# Patient Record
Sex: Female | Born: 1965 | Race: Black or African American | Hispanic: No | Marital: Single | Smoking: Former smoker
Health system: Southern US, Community
[De-identification: ages and names within clinical notes are randomized; demographics above are authoritative.]

## PROBLEM LIST (undated history)

## (undated) DIAGNOSIS — I10 Essential (primary) hypertension: Secondary | ICD-10-CM

---

## 2011-08-28 ENCOUNTER — Emergency Department (HOSPITAL_BASED_OUTPATIENT_CLINIC_OR_DEPARTMENT_OTHER)
Admission: EM | Admit: 2011-08-28 | Discharge: 2011-08-28 | Disposition: A | Discharge status: Home / Self Care | Payer: BC Managed Care – PPO | Attending: Emergency Medicine | Admitting: Emergency Medicine

## 2011-08-28 ENCOUNTER — Emergency Department (INDEPENDENT_AMBULATORY_CARE_PROVIDER_SITE_OTHER): Payer: BC Managed Care – PPO

## 2011-08-28 ENCOUNTER — Other Ambulatory Visit: Payer: Self-pay

## 2011-08-28 ENCOUNTER — Encounter: Payer: Self-pay | Admitting: *Deleted

## 2011-08-28 DIAGNOSIS — R000 Tachycardia, unspecified: Secondary | ICD-10-CM

## 2011-08-28 DIAGNOSIS — R079 Chest pain, unspecified: Secondary | ICD-10-CM | POA: Insufficient documentation

## 2011-08-28 DIAGNOSIS — I10 Essential (primary) hypertension: Secondary | ICD-10-CM | POA: Insufficient documentation

## 2011-08-28 DIAGNOSIS — R0602 Shortness of breath: Secondary | ICD-10-CM | POA: Insufficient documentation

## 2011-08-28 HISTORY — DX: Essential (primary) hypertension: I10

## 2011-08-28 LAB — CBC
HCT: 39.1 % (ref 36.0–46.0)
Hemoglobin: 13.5 g/dL (ref 12.0–15.0)
MCHC: 34.5 g/dL (ref 30.0–36.0)
MCV: 86.1 fL (ref 78.0–100.0)

## 2011-08-28 LAB — BASIC METABOLIC PANEL
BUN: 10 mg/dL (ref 6–23)
GFR calc non Af Amer: >60 mL/min (ref >60)
Glucose, Bld: 102 mg/dL — ABNORMAL HIGH (ref 70–99)
Potassium: 3.3 mEq/L — ABNORMAL LOW (ref 3.5–5.1)

## 2011-08-28 LAB — CARDIAC PANEL(CRET KIN+CKTOT+MB+TROPI)
Relative Index: 1 (ref 0.0–2.5)
Troponin I: <0.3 ng/mL (ref <0.30)

## 2011-08-28 MED ORDER — IOHEXOL 350 MG/ML SOLN
80.0000 mL | Freq: Once | INTRAVENOUS | Status: AC | PRN
  Administered 2011-08-28: 80 mL via INTRAVENOUS

## 2011-08-28 NOTE — ED Notes (Signed)
States her heart has been pounding on and off for a week. Pain in her upper chest into her back.  No sob. Denies diaphoresis.

## 2011-08-28 NOTE — ED Provider Notes (Signed)
History     CSN: [id number] Arrival date & time: 08/28/2011  6:35 PM  Chief Complaint  Patient presents with  . Chest Pain    (Consider location/radiation/quality/duration/timing/severity/associated sxs/prior treatment) HPI Comments: Pt states that she fractured her wrist 9 weeks ago and she has been laying around since:pt denies history of similar symptoms  Patient is a 45 y.o. female presenting with chest pain. The history is provided by the patient. No language interpreter was used.  Chest Pain The chest pain began 5 - 7 days ago. Chest pain occurs intermittently. The chest pain is unchanged. The quality of the pain is described as sharp. The pain does not radiate. Primary symptoms include shortness of breath and palpitations. Pertinent negatives for primary symptoms include no fever, no syncope, no cough, no wheezing, no nausea and no vomiting.  The palpitations also occurred with shortness of breath.  She tried nothing for the symptoms. Risk factors include lack of exercise and obesity.     Past Medical History  Diagnosis Date  . Hypertension     History reviewed. No pertinent past surgical history.  No family history on file.  History  Substance Use Topics  . Smoking status: Current Some Day Smoker  . Smokeless tobacco: Not on file  . Alcohol Use: No    OB History    Grav Para Term Preterm Abortions TAB SAB Ect Mult Living                  Review of Systems  Constitutional: Negative for fever.  Respiratory: Positive for shortness of breath. Negative for cough and wheezing.   Cardiovascular: Positive for chest pain and palpitations. Negative for syncope.  Gastrointestinal: Negative for nausea and vomiting.  All other systems reviewed and are negative.    Allergies  Sulfa antibiotics  Home Medications   Current Outpatient Rx  Name Route Sig Dispense Refill  . ALBUTEROL SULFATE HFA 108 (90 BASE) MCG/ACT IN AERS Inhalation Inhale 2 puffs into the lungs  every 6 (six) hours as needed. For shortness of breath     . AMLODIPINE BESYLATE-VALSARTAN 10-160 MG PO TABS Oral Take 1 tablet by mouth daily.      Marland Kitchen FLUTICASONE-SALMETEROL 250-50 MCG/DOSE IN AEPB Inhalation Inhale 1 puff into the lungs every morning.      Marland Kitchen LOVASTATIN 20 MG PO TABS Oral Take by mouth every morning.      Marland Kitchen OMEPRAZOLE 20 MG PO CPDR Oral Take 20 mg by mouth daily.        BP 150/88  Pulse 120  Temp(Src) 98.9 F (37.2 C) (Oral)  Resp 22  SpO2 100%  Physical Exam  Nursing note and vitals reviewed. Constitutional: She is oriented to person, place, and time. She appears well-developed and well-nourished.  HENT:  Head: Normocephalic and atraumatic.  Eyes: Pupils are equal, round, and reactive to light.  Neck: Normal range of motion.  Cardiovascular: Normal rate and regular rhythm.   Pulmonary/Chest: Effort normal and breath sounds normal.  Abdominal: Soft. Bowel sounds are normal.  Musculoskeletal: Normal range of motion.  Neurological: She is alert and oriented to person, place, and time.  Skin: Skin is warm and dry.  Psychiatric: She has a normal mood and affect.    ED Course  Procedures (including critical care time)  Labs Reviewed  CBC - Abnormal; Notable for the following:    WBC 12.7 (*)    All other components within normal limits  BASIC METABOLIC PANEL - Abnormal; Notable for  the following:    Potassium 3.3 (*)    Glucose, Bld 102 (*)    All other components within normal limits  CARDIAC PANEL(CRET KIN+CKTOT+MB+TROPI) - Abnormal; Notable for the following:    Total CK 190 (*)    All other components within normal limits   No results found.   No diagnosis found.  Date: 08/28/2011  Rate: 112  Rhythm: sinus tachycardia  QRS Axis: normal  Intervals: normal  ST/T Wave abnormalities: normal  Conduction Disutrbances:none  Narrative Interpretation:   Old EKG Reviewed: none available    MDM   Pt not having cp at this time:symptoms have been  going on for one week:pt ekg not acute:discussed with pt that she should have her thyroid checked, pt that's that she "had the thyroid removed"        Teressa Lower, NP 08/28/11 2047

## 2011-08-28 NOTE — ED Notes (Signed)
Long conversation with pt..stressed over several different things for several weeks

## 2011-08-28 NOTE — ED Provider Notes (Signed)
Medical screening examination/treatment/procedure(s) were performed by non-physician practitioner and as supervising physician I was immediately available for consultation/collaboration.  Derian Pfost R. Borna Wessinger, MD 08/28/11 2300 

## 2012-03-03 DIAGNOSIS — M549 Dorsalgia, unspecified: Secondary | ICD-10-CM | POA: Insufficient documentation

## 2013-05-07 DIAGNOSIS — B009 Herpesviral infection, unspecified: Secondary | ICD-10-CM | POA: Insufficient documentation

## 2013-05-07 DIAGNOSIS — K219 Gastro-esophageal reflux disease without esophagitis: Secondary | ICD-10-CM | POA: Insufficient documentation

## 2013-05-07 DIAGNOSIS — M5430 Sciatica, unspecified side: Secondary | ICD-10-CM | POA: Insufficient documentation

## 2013-05-07 DIAGNOSIS — N3020 Other chronic cystitis without hematuria: Secondary | ICD-10-CM | POA: Insufficient documentation

## 2013-05-07 DIAGNOSIS — J45909 Unspecified asthma, uncomplicated: Secondary | ICD-10-CM | POA: Insufficient documentation

## 2013-08-26 DIAGNOSIS — E785 Hyperlipidemia, unspecified: Secondary | ICD-10-CM | POA: Insufficient documentation

## 2013-08-26 DIAGNOSIS — J309 Allergic rhinitis, unspecified: Secondary | ICD-10-CM | POA: Insufficient documentation

## 2013-08-26 DIAGNOSIS — E668 Other obesity: Secondary | ICD-10-CM | POA: Insufficient documentation

## 2013-08-31 ENCOUNTER — Encounter: Payer: Self-pay | Admitting: Obstetrics & Gynecology

## 2013-09-01 DIAGNOSIS — G4710 Hypersomnia, unspecified: Secondary | ICD-10-CM | POA: Insufficient documentation

## 2013-09-01 DIAGNOSIS — I10 Essential (primary) hypertension: Secondary | ICD-10-CM | POA: Insufficient documentation

## 2013-09-01 DIAGNOSIS — M1990 Unspecified osteoarthritis, unspecified site: Secondary | ICD-10-CM | POA: Insufficient documentation

## 2013-09-09 DIAGNOSIS — R928 Other abnormal and inconclusive findings on diagnostic imaging of breast: Secondary | ICD-10-CM | POA: Insufficient documentation

## 2013-09-16 DIAGNOSIS — M5414 Radiculopathy, thoracic region: Secondary | ICD-10-CM | POA: Insufficient documentation

## 2013-09-16 DIAGNOSIS — M4716 Other spondylosis with myelopathy, lumbar region: Secondary | ICD-10-CM | POA: Insufficient documentation

## 2013-09-19 ENCOUNTER — Ambulatory Visit: Payer: Self-pay | Admitting: Obstetrics & Gynecology

## 2013-10-26 ENCOUNTER — Ambulatory Visit (INDEPENDENT_AMBULATORY_CARE_PROVIDER_SITE_OTHER): Payer: BC Managed Care – PPO | Admitting: Obstetrics & Gynecology

## 2013-10-26 ENCOUNTER — Encounter: Payer: Self-pay | Admitting: Obstetrics & Gynecology

## 2013-10-26 VITALS — BP 149/87 | HR 93 | Temp 97.4°F | Ht 67.0 in | Wt 354.0 lb

## 2013-10-26 DIAGNOSIS — Z01419 Encounter for gynecological examination (general) (routine) without abnormal findings: Secondary | ICD-10-CM

## 2013-10-26 DIAGNOSIS — N760 Acute vaginitis: Secondary | ICD-10-CM

## 2013-10-26 MED ORDER — NITROFURANTOIN MONOHYD MACRO 100 MG PO CAPS: ORAL_CAPSULE | ORAL | Status: DC

## 2013-10-26 MED ORDER — LEVONORGESTREL-ETHINYL ESTRAD 0.15-30 MG-MCG PO TABS: 1.0000 | ORAL_TABLET | Freq: Every day | ORAL | Status: DC

## 2013-10-26 NOTE — Progress Notes (Signed)
Subjective:     Anna Contreras is a 47 y.o. female here for a routine annual exam.  Current complaints: pt states that before she starts her period she feels as though she has yeast/bacterial infection.  Pt states that she has been prescribed macrobid by Dr Mikey Bussing previously. Pt states that she did have relief with this.  Personal health questionnaire reviewed: yes.   Gynecologic History Patient's last menstrual period was 09/21/2013. Contraception: none Last Pap: 2012. Results were: normal Last mammogram: May 2014. Results were: abnormal- pt states was given result of fatty tissue. Pt to f/u in 19mo  Obstetric History OB History  Gravida Para Term Preterm AB SAB TAB Ectopic Multiple Living  2 2 2       2     # Outcome Date GA Lbr Len/2nd Weight Sex Delivery Anes PTL Lv  2 TRM 08/19/93    F    Y  1 TRM 11/29/87    F    Y       The following portions of the patient's history were reviewed and updated as appropriate: allergies, current medications, past family history, past medical history, past social history, past surgical history and problem list.  Review of Systems Pertinent items are noted in HPI.    Objective:       General appearance: alert Breasts: normal appearance, no masses or tenderness Abdomen: soft, non-tender; bowel sounds normal; no masses,  no organomegaly Pelvic: cervix normal in appearance, external genitalia normal, no adnexal masses or tenderness, uterus normal size, shape, and consistency and vagina normal without discharge       Assessment:   Normal exam Plan:   Orders Placed This Encounter  Procedures  . WET PREP BY MOLECULAR PROBE  Return prn

## 2013-10-27 NOTE — Patient Instructions (Addendum)

## 2013-10-31 LAB — PAP IG, CT-NG NAA, HPV HIGH-RISK: GC Probe Amp: NEGATIVE

## 2013-11-11 DIAGNOSIS — R609 Edema, unspecified: Secondary | ICD-10-CM | POA: Insufficient documentation

## 2013-11-18 ENCOUNTER — Other Ambulatory Visit: Payer: Self-pay | Admitting: *Deleted

## 2013-11-18 MED ORDER — METRONIDAZOLE 0.75 % VA GEL: VAGINAL | Status: DC

## 2013-11-25 DIAGNOSIS — R9430 Abnormal result of cardiovascular function study, unspecified: Secondary | ICD-10-CM | POA: Insufficient documentation

## 2014-02-09 DIAGNOSIS — G4733 Obstructive sleep apnea (adult) (pediatric): Secondary | ICD-10-CM | POA: Insufficient documentation

## 2014-05-01 DIAGNOSIS — R7303 Prediabetes: Secondary | ICD-10-CM | POA: Insufficient documentation

## 2014-05-01 DIAGNOSIS — F419 Anxiety disorder, unspecified: Secondary | ICD-10-CM

## 2014-05-01 DIAGNOSIS — F329 Major depressive disorder, single episode, unspecified: Secondary | ICD-10-CM | POA: Insufficient documentation

## 2014-10-02 ENCOUNTER — Encounter: Payer: Self-pay | Admitting: Obstetrics & Gynecology

## 2014-10-16 ENCOUNTER — Other Ambulatory Visit: Payer: Self-pay | Admitting: *Deleted

## 2014-10-16 MED ORDER — LEVONORGESTREL-ETHINYL ESTRAD 0.15-30 MG-MCG PO TABS: 1.0000 | ORAL_TABLET | Freq: Every day | ORAL | Status: DC

## 2014-10-16 NOTE — Progress Notes (Unsigned)
Pt called to office requesting refill on birth control.  Pt has an AEX later this month.  Refill was sent to pharmacy to get pt through AEX date.

## 2014-10-30 ENCOUNTER — Ambulatory Visit: Payer: BC Managed Care – PPO | Admitting: Obstetrics & Gynecology

## 2014-11-17 ENCOUNTER — Other Ambulatory Visit: Payer: Self-pay | Admitting: Obstetrics & Gynecology

## 2014-11-27 ENCOUNTER — Encounter: Payer: Self-pay | Admitting: *Deleted

## 2014-11-28 ENCOUNTER — Encounter: Payer: Self-pay | Admitting: Obstetrics & Gynecology

## 2014-12-11 ENCOUNTER — Telehealth: Payer: Self-pay | Admitting: *Deleted

## 2014-12-11 ENCOUNTER — Ambulatory Visit: Payer: BC Managed Care – PPO | Admitting: Obstetrics & Gynecology

## 2014-12-11 DIAGNOSIS — Z3041 Encounter for surveillance of contraceptive pills: Secondary | ICD-10-CM

## 2014-12-11 DIAGNOSIS — B3731 Acute candidiasis of vulva and vagina: Secondary | ICD-10-CM

## 2014-12-11 DIAGNOSIS — B373 Candidiasis of vulva and vagina: Secondary | ICD-10-CM

## 2014-12-11 MED ORDER — LEVONORGESTREL-ETHINYL ESTRAD 0.15-30 MG-MCG PO TABS: 1.0000 | ORAL_TABLET | Freq: Every day | ORAL | Status: DC

## 2014-12-11 MED ORDER — FLUCONAZOLE 150 MG PO TABS: 150.0000 mg | ORAL_TABLET | Freq: Once | ORAL | Status: DC

## 2014-12-11 NOTE — Telephone Encounter (Signed)
Patient got letter and she needs an annual exam. Patient wants to see the midwife when she gets here. Patient request a refill on her birth control and she states she needs Diflucan for a yeast infection. LM on VM- Rx refilled at pharmacy listed.

## 2015-01-15 ENCOUNTER — Other Ambulatory Visit: Payer: Self-pay | Admitting: *Deleted

## 2015-01-15 DIAGNOSIS — Z3041 Encounter for surveillance of contraceptive pills: Secondary | ICD-10-CM

## 2015-01-15 MED ORDER — LEVONORGESTREL-ETHINYL ESTRAD 0.15-30 MG-MCG PO TABS: 1.0000 | ORAL_TABLET | Freq: Every day | ORAL | Status: DC

## 2015-01-18 ENCOUNTER — Ambulatory Visit: Payer: BC Managed Care – PPO | Admitting: Certified Nurse Midwife

## 2015-01-24 ENCOUNTER — Ambulatory Visit: Payer: BC Managed Care – PPO | Admitting: Certified Nurse Midwife

## 2015-01-30 ENCOUNTER — Encounter: Payer: Self-pay | Admitting: Certified Nurse Midwife

## 2015-01-30 ENCOUNTER — Ambulatory Visit (INDEPENDENT_AMBULATORY_CARE_PROVIDER_SITE_OTHER): Payer: BC Managed Care – PPO | Admitting: Certified Nurse Midwife

## 2015-01-30 DIAGNOSIS — B9689 Other specified bacterial agents as the cause of diseases classified elsewhere: Secondary | ICD-10-CM

## 2015-01-30 DIAGNOSIS — A499 Bacterial infection, unspecified: Secondary | ICD-10-CM

## 2015-01-30 DIAGNOSIS — N390 Urinary tract infection, site not specified: Secondary | ICD-10-CM

## 2015-01-30 DIAGNOSIS — N760 Acute vaginitis: Secondary | ICD-10-CM

## 2015-01-30 DIAGNOSIS — Z3041 Encounter for surveillance of contraceptive pills: Secondary | ICD-10-CM

## 2015-01-30 DIAGNOSIS — Z01419 Encounter for gynecological examination (general) (routine) without abnormal findings: Secondary | ICD-10-CM | POA: Diagnosis not present

## 2015-01-30 MED ORDER — METRONIDAZOLE 0.75 % VA GEL: VAGINAL | Status: DC

## 2015-01-30 MED ORDER — METRONIDAZOLE 250 MG PO TABS: 250.0000 mg | ORAL_TABLET | Freq: Two times a day (BID) | ORAL | Status: AC

## 2015-01-30 MED ORDER — LEVONORGESTREL-ETHINYL ESTRAD 0.15-30 MG-MCG PO TABS: 1.0000 | ORAL_TABLET | Freq: Every day | ORAL | Status: DC

## 2015-01-30 MED ORDER — NITROFURANTOIN MONOHYD MACRO 100 MG PO CAPS: ORAL_CAPSULE | ORAL | Status: DC

## 2015-01-30 NOTE — Progress Notes (Signed)
Patient ID: Anna Contreras, female   DOB: September 19, 1966, 49 y.o.   MRN: [id number]   Subjective:     Anna Contreras is a 49 y.o. female here for a routine exam.  Current complaints: hot flashes, weight.  Denies any issues with menses, last 3-4 days with light bleeding.  Reports frequent UTI's post intercourse.  Reports "fishy" smell after sexual intercourse.  Desires to continue OCPs.    Personal health questionnaire:  Is patient Ashkenazi Jewish, have a family history of breast and/or ovarian cancer: no Is there a family history of uterine cancer diagnosed at age < 76, gastrointestinal cancer, urinary tract cancer, family member who is a Personnel officer syndrome-associated carrier: no Is the patient overweight and hypertensive, family history of diabetes, personal history of gestational diabetes, preeclampsia or PCOS: yes Is patient over 57, have PCOS,  family history of premature CHD under age 77, diabetes, smoke, have hypertension or peripheral artery disease:  yes At any time, has a partner hit, kicked or otherwise hurt or frightened you?: no Over the past 2 weeks, have you felt down, depressed or hopeless?: no Over the past 2 weeks, have you felt little interest or pleasure in doing things?:no   Gynecologic History Patient's last menstrual period was 01/23/2015. Contraception: OCP (estrogen/progesterone) Last Pap: 10/26/13. Results were: normal Last mammogram: unknown. Results were: according to the patient normal.  Obstetric History OB History  Gravida Para Term Preterm AB SAB TAB Ectopic Multiple Living  # Outcome Date GA Lbr Len/2nd Weight Sex Delivery Anes PTL Lv  2 Term 08/19/93    F    Y  1 Term 11/29/87    Anna Contreras      Past Medical History  Diagnosis Date  . Hypertension     History reviewed. No pertinent past surgical history.   Current outpatient prescriptions:  .  albuterol (PROVENTIL HFA;VENTOLIN HFA) 108 (90 BASE) MCG/ACT inhaler, Inhale 2 puffs into  the lungs every 6 (six) hours as needed. For shortness of breath , Disp: , Rfl:  .  amitriptyline (ELAVIL) 25 MG tablet, , Disp: , Rfl:  .  amLODipine-valsartan (EXFORGE) 10-320 MG per tablet, Take 1 tablet by mouth daily., Disp: , Rfl:  .  cyclobenzaprine (FLEXERIL) 5 MG tablet, , Disp: , Rfl:  .  Fluticasone-Salmeterol (ADVAIR DISKUS) 250-50 MCG/DOSE AEPB, Inhale 1 puff into the lungs every morning.  , Disp: , Rfl:  .  gabapentin (NEURONTIN) 300 MG capsule, , Disp: , Rfl:  .  levonorgestrel-ethinyl estradiol (PORTIA-28) 0.15-30 MG-MCG tablet, Take 1 tablet by mouth daily., Disp: 28 tablet, Rfl: 1 .  lovastatin (MEVACOR) 20 MG tablet, Take by mouth every morning.  , Disp: , Rfl:  .  metFORMIN (GLUCOPHAGE) 500 MG tablet, , Disp: , Rfl:  .  montelukast (SINGULAIR) 10 MG tablet, , Disp: , Rfl:  .  nitrofurantoin, macrocrystal-monohydrate, (MACROBID) 100 MG capsule, Take one tablet prior to intercourse as instructed, Disp: 30 capsule, Rfl: 5 .  omeprazole (PRILOSEC) 40 MG capsule, Take 40 mg by mouth daily.  , Disp: , Rfl:  .  Phentermine-Topiramate 3.75-23 MG CP24, Take by mouth., Disp: , Rfl:  .  potassium chloride SA (K-DUR,KLOR-CON) 20 MEQ tablet, , Disp: , Rfl:  .  fluconazole (DIFLUCAN) 150 MG tablet, Take 1 tablet (150 mg total) by mouth once., Disp: 1 tablet, Rfl: 2 .  metroNIDAZOLE (FLAGYL) 250 MG tablet, Take 1 tablet (250  mg total) by mouth 2 (two) times daily., Disp: 20 tablet, Rfl: 0 .  metroNIDAZOLE (METROGEL VAGINAL) 0.75 % vaginal gel, 1 applicator per vagina twice daily for five days then 2 times per week for 3 months., Disp: 70 g, Rfl: 3 .  omeprazole (PRILOSEC) 20 MG capsule, Take 20 mg by mouth daily.  , Disp: , Rfl:  .  topiramate (TOPAMAX) 50 MG tablet, , Disp: , Rfl:  .  triamcinolone cream (KENALOG) 0.1 %, , Disp: , Rfl:  Allergies  Allergen Reactions  . Sulfa Antibiotics Hives    History  Substance Use Topics  . Smoking status: Former Smoker    Quit date: 12/01/1997   . Smokeless tobacco: Not on file  . Alcohol Use: Yes     Comment: social    Family History  Problem Relation Age of Onset  . Diabetes Mother   . Heart disease Mother   . Heart disease Father   . Diabetes Maternal Grandmother   . Heart disease Paternal Grandmother       Review of Systems  Constitutional: negative for fatigue and weight loss Respiratory: negative for cough and wheezing Cardiovascular: negative for chest pain, fatigue and palpitations Gastrointestinal: negative for abdominal pain and change in bowel habits Musculoskeletal:negative for myalgias Neurological: negative for gait problems and tremors Behavioral/Psych: negative for abusive relationship, depression Endocrine: negative for temperature intolerance   Genitourinary:negative for abnormal menstrual periods, genital lesions, hot flashes, sexual problems and vaginal discharge Integument/breast: negative for breast lump, breast tenderness, nipple discharge and skin lesion(s)    Objective:       BP 146/87 mmHg  Pulse 108  Wt 172.82 kg (381 lb)  LMP 01/23/2015 General:   alert  Skin:   no rash or abnormalities  Lungs:   clear to auscultation bilaterally  Heart:   regular rate and rhythm, S1, S2 normal, no murmur, click, rub or gallop  Breasts:   normal without suspicious masses, skin or nipple changes or axillary nodes  Abdomen:  normal findings: no organomegaly, soft, non-tender and no hernia  Pelvis:  External genitalia: normal general appearance Urinary system: urethral meatus normal and bladder without fullness, nontender Vaginal: normal without tenderness, induration or masses Cervix: normal appearance Adnexa: normal bimanual exam Uterus: anteverted and non-tender, normal size   Lab Review Urine pregnancy test Labs reviewed yes Radiologic studies reviewed no   Assessment:    Healthy female exam.  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 Contraception: OCP (estrogen/progesterone). Mammogram ordered. Follow up in: 1 year.   Meds ordered this encounter  Medications  . nitrofurantoin, macrocrystal-monohydrate, (MACROBID) 100 MG capsule    Sig: Take one tablet prior to intercourse as instructed    Dispense:  30 capsule    Refill:  5  . metroNIDAZOLE (METROGEL VAGINAL) 0.75 % vaginal gel    Sig: 1 applicator per vagina twice daily for five days then 2 times per week for 3 months.    Dispense:  70 g    Refill:  3  . levonorgestrel-ethinyl estradiol (PORTIA-28) 0.15-30 MG-MCG tablet    Sig: Take 1 tablet by mouth daily.    Dispense:  28 tablet    Refill:  1    CYCLE FILL MEDICATION. Authorization is required for next refill.  . metroNIDAZOLE (FLAGYL) 250 MG tablet    Sig: Take 1  tablet (250 mg total) by mouth 2 (two) times daily.    Dispense:  20 tablet    Refill:  0   Orders Placed This Encounter  Procedures  . SureSwab, Vaginosis/Vaginitis Plus  . Ambulatory referral to Nutrition and Diabetic Education    Referral Priority:  Routine    Referral Type:  Consultation    Referral Reason:  Specialty Services Required    Number of Visits Requested:  1

## 2015-01-31 ENCOUNTER — Telehealth: Payer: Self-pay | Admitting: *Deleted

## 2015-01-31 NOTE — Telephone Encounter (Signed)
Patient states she was in the office and discussed Phentermine for weight loss. She would like to try that and request that a Rx be sent to the pharmacy.

## 2015-02-01 LAB — PAP IG AND HPV HIGH-RISK: HPV DNA High Risk: NOT DETECTED

## 2015-02-01 NOTE — Telephone Encounter (Signed)
OK, per Dr. Clearance CootsHarper.  He gave the go ahead.  Please write and have him sign.

## 2015-02-02 LAB — SURESWAB, VAGINOSIS/VAGINITIS PLUS
ATOPOBIUM VAGINAE: NOT DETECTED Log (cells/mL)
C. PARAPSILOSIS, DNA: NOT DETECTED
C. TRACHOMATIS RNA, TMA: NOT DETECTED
C. albicans, DNA: NOT DETECTED
C. glabrata, DNA: NOT DETECTED
C. tropicalis, DNA: NOT DETECTED
Gardnerella vaginalis: 5 Log (cells/mL)
LACTOBACILLUS SPECIES: NOT DETECTED Log (cells/mL)
MEGASPHAERA SPECIES: NOT DETECTED Log (cells/mL)
N. gonorrhoeae RNA, TMA: NOT DETECTED
T. vaginalis RNA, QL TMA: NOT DETECTED

## 2015-02-05 ENCOUNTER — Other Ambulatory Visit: Payer: Self-pay | Admitting: *Deleted

## 2015-02-05 DIAGNOSIS — E668 Other obesity: Secondary | ICD-10-CM

## 2015-02-05 MED ORDER — PHENTERMINE HCL 37.5 MG PO CAPS: 37.5000 mg | ORAL_CAPSULE | ORAL | Status: DC

## 2015-02-06 NOTE — Telephone Encounter (Signed)
Patient notified Rx ready- patient request we mail it. Patient advised of side effects and to track her BP- patient voices understanding.

## 2015-04-14 ENCOUNTER — Other Ambulatory Visit: Payer: Self-pay | Admitting: Certified Nurse Midwife

## 2015-04-18 ENCOUNTER — Telehealth: Payer: Self-pay | Admitting: *Deleted

## 2015-04-18 NOTE — Telephone Encounter (Signed)
Patient states she needs her refill on her birth control- she is out- Rx request sent to [facility].

## 2015-04-19 ENCOUNTER — Other Ambulatory Visit: Payer: Self-pay | Admitting: *Deleted

## 2015-04-19 ENCOUNTER — Other Ambulatory Visit: Payer: Self-pay | Admitting: Certified Nurse Midwife

## 2015-04-19 DIAGNOSIS — Z3041 Encounter for surveillance of contraceptive pills: Secondary | ICD-10-CM

## 2015-04-19 MED ORDER — LEVONORGESTREL-ETHINYL ESTRAD 0.15-30 MG-MCG PO TABS: 1.0000 | ORAL_TABLET | Freq: Every day | ORAL | Status: DC

## 2015-06-07 ENCOUNTER — Other Ambulatory Visit: Payer: Self-pay | Admitting: Obstetrics

## 2015-09-27 ENCOUNTER — Telehealth: Payer: Self-pay | Admitting: *Deleted

## 2015-09-27 ENCOUNTER — Other Ambulatory Visit: Payer: Self-pay | Admitting: Certified Nurse Midwife

## 2015-09-27 NOTE — Telephone Encounter (Signed)
Patient is asking for a refill of Phentermine. ( Please see problem list)

## 2015-09-27 NOTE — Telephone Encounter (Signed)
Please offer her a consultation for [facility] for gastric [facility].  I do not think with her hx of high blood pressure, obstructive sleep apnea, etc. That she is a good candidate for phentermine.  Depending on her insurance she may qualify for another weight loss medication.  Thank you.  Felisa Bonier. Emilee Market CNM

## 2015-09-28 ENCOUNTER — Other Ambulatory Visit: Payer: Self-pay | Admitting: Obstetrics

## 2015-10-02 ENCOUNTER — Other Ambulatory Visit: Payer: Self-pay | Admitting: Obstetrics

## 2015-10-03 NOTE — Telephone Encounter (Signed)
Patient notified she needs to see her primary care for management- longer term weight loss.

## 2015-10-30 ENCOUNTER — Other Ambulatory Visit: Payer: Self-pay | Admitting: Obstetrics

## 2015-12-25 ENCOUNTER — Other Ambulatory Visit: Payer: Self-pay | Admitting: Obstetrics

## 2015-12-25 NOTE — Telephone Encounter (Signed)
Patient called for a refill of her birth control. She is due an annual exam in March.

## 2016-03-12 ENCOUNTER — Other Ambulatory Visit: Payer: Self-pay | Admitting: Certified Nurse Midwife

## 2016-03-12 ENCOUNTER — Ambulatory Visit (INDEPENDENT_AMBULATORY_CARE_PROVIDER_SITE_OTHER): Payer: BC Managed Care – PPO | Admitting: Certified Nurse Midwife

## 2016-03-12 ENCOUNTER — Encounter: Payer: Self-pay | Admitting: Certified Nurse Midwife

## 2016-03-12 VITALS — BP 157/92 | HR 118 | Temp 98.9°F | Wt 377.0 lb

## 2016-03-12 DIAGNOSIS — Z01419 Encounter for gynecological examination (general) (routine) without abnormal findings: Secondary | ICD-10-CM

## 2016-03-12 DIAGNOSIS — Z1389 Encounter for screening for other disorder: Secondary | ICD-10-CM

## 2016-03-12 DIAGNOSIS — L918 Other hypertrophic disorders of the skin: Secondary | ICD-10-CM

## 2016-03-12 DIAGNOSIS — N760 Acute vaginitis: Secondary | ICD-10-CM | POA: Diagnosis not present

## 2016-03-12 DIAGNOSIS — L981 Factitial dermatitis: Secondary | ICD-10-CM | POA: Diagnosis not present

## 2016-03-12 DIAGNOSIS — E669 Obesity, unspecified: Secondary | ICD-10-CM

## 2016-03-12 DIAGNOSIS — Z3041 Encounter for surveillance of contraceptive pills: Secondary | ICD-10-CM

## 2016-03-12 DIAGNOSIS — R319 Hematuria, unspecified: Secondary | ICD-10-CM

## 2016-03-12 LAB — POCT URINALYSIS DIPSTICK
BILIRUBIN UA: NEGATIVE
GLUCOSE UA: NEGATIVE
KETONES UA: NEGATIVE
Nitrite, UA: NEGATIVE
PH UA: 5
SPEC GRAV UA: 1.01
Urobilinogen, UA: NEGATIVE

## 2016-03-12 MED ORDER — TERCONAZOLE 0.4 % VA CREA: 1.0000 | TOPICAL_CREAM | Freq: Every day | VAGINAL | Status: DC

## 2016-03-12 MED ORDER — NORETHINDRONE 0.35 MG PO TABS
1.0000 | ORAL_TABLET | Freq: Every day | ORAL | Status: DC
Start: 2016-03-12 — End: 2017-03-30

## 2016-03-12 MED ORDER — FLUCONAZOLE 200 MG PO TABS: 200.0000 mg | ORAL_TABLET | Freq: Once | ORAL | Status: DC

## 2016-03-12 NOTE — Addendum Note (Signed)
Addended by: Marya LandryFOSTER, SUZANNE D on: 03/12/2016 04:56 PM   Modules accepted: Orders

## 2016-03-12 NOTE — Progress Notes (Signed)
Patient ID: Anna Contreras, female   DOB: 11-Jun-1966, 50 y.o.   MRN: [id number]    Subjective:        Anna Contreras is a 50 y.o. female here for a routine exam.  Current complaints: desires skin tag removal.  States that she has mammograms done in [place].  Declines STD screening.  States that she is on disability now.  Will have full medicare in June-July.    Personal health questionnaire:  Is patient Ashkenazi Jewish, have a family history of breast and/or ovarian cancer: no Is there a family history of uterine cancer diagnosed at age < 68, gastrointestinal cancer, urinary tract cancer, family member who is a Personnel officer syndrome-associated carrier: no Is the patient overweight and hypertensive, family history of diabetes, personal history of gestational diabetes, preeclampsia or PCOS: yes Is patient over 9, have PCOS,  family history of premature CHD under age 82, diabetes, smoke, have hypertension or peripheral artery disease:  yes At any time, has a partner hit, kicked or otherwise hurt or frightened you?: no Over the past 2 weeks, have you felt down, depressed or hopeless?: yes Over the past 2 weeks, have you felt little interest or pleasure in doing things?:no   Gynecologic History No LMP recorded. Contraception: OCP (estrogen/progesterone) Last Pap: 01/2015. Results were: normal Last mammogram: unknown, ordered but patient did not schedule the study.  Obstetric History OB History  Gravida Para Term Preterm AB SAB TAB Ectopic Multiple Living  # Outcome Date GA Lbr Len/2nd Weight Sex Delivery Anes PTL Lv  2 Term 08/19/93    F    Y  1 Term 11/29/87    [name]      Past Medical History  Diagnosis Date  . Hypertension     No past surgical history on file.   Current outpatient prescriptions:  .  albuterol (PROVENTIL HFA;VENTOLIN HFA) 108 (90 BASE) MCG/ACT inhaler, Inhale 2 puffs into the lungs every 6 (six) hours as needed. For shortness of breath ,  Disp: , Rfl:  .  amitriptyline (ELAVIL) 25 MG tablet, , Disp: , Rfl:  .  amLODipine-valsartan (EXFORGE) 10-320 MG per tablet, Take 1 tablet by mouth daily., Disp: , Rfl:  .  cyclobenzaprine (FLEXERIL) 5 MG tablet, , Disp: , Rfl:  .  Fluticasone-Salmeterol (ADVAIR DISKUS) 250-50 MCG/DOSE AEPB, Inhale 1 puff into the lungs every morning.  , Disp: , Rfl:  .  gabapentin (NEURONTIN) 300 MG capsule, , Disp: , Rfl:  .  lovastatin (MEVACOR) 20 MG tablet, Take by mouth every morning.  , Disp: , Rfl:  .  metFORMIN (GLUCOPHAGE) 500 MG tablet, , Disp: , Rfl:  .  metroNIDAZOLE (METROGEL VAGINAL) 0.75 % vaginal gel, 1 applicator per vagina twice daily for five days then 2 times per week for 3 months., Disp: 70 g, Rfl: 3 .  montelukast (SINGULAIR) 10 MG tablet, , Disp: , Rfl:  .  nitrofurantoin, macrocrystal-monohydrate, (MACROBID) 100 MG capsule, Take one tablet prior to intercourse as instructed, Disp: 30 capsule, Rfl: 5 .  omeprazole (PRILOSEC) 20 MG capsule, Take 20 mg by mouth daily.  , Disp: , Rfl:  .  PORTIA-28 0.15-30 MG-MCG tablet, TAKE 1 TABLET BY MOUTH DAILY., Disp: 28 tablet, Rfl: 2 .  potassium chloride SA (K-DUR,KLOR-CON) 20 MEQ tablet, , Disp: , Rfl:  .  topiramate (TOPAMAX) 50 MG tablet, , Disp: , Rfl:  .  fluconazole (DIFLUCAN)  200 MG tablet, Take 1 tablet (200 mg total) by mouth once., Disp: 3 tablet, Rfl: 0 .  norethindrone (MICRONOR,CAMILA,ERRIN) 0.35 MG tablet, Take 1 tablet (0.35 mg total) by mouth daily., Disp: 1 Package, Rfl: 11 .  terconazole (TERAZOL 7) 0.4 % vaginal cream, Place 1 applicator vaginally at bedtime., Disp: 45 g, Rfl: 0 .  triamcinolone cream (KENALOG) 0.1 %, Reported on 03/12/2016, Disp: , Rfl:  Allergies  Allergen Reactions  . Sulfa Antibiotics Hives    Social History  Substance Use Topics  . Smoking status: Former Smoker    Quit date: 12/01/1997  . Smokeless tobacco: Not on file  . Alcohol Use: Yes     Comment: social    Family History  Problem Relation Age  of Onset  . Diabetes Mother   . Heart disease Mother   . Heart disease Father   . Diabetes Maternal Grandmother   . Heart disease Paternal Grandmother       Review of Systems  Constitutional: negative for fatigue and weight loss Respiratory: negative for cough and wheezing Cardiovascular: negative for chest pain, fatigue and palpitations Gastrointestinal: negative for abdominal pain and change in bowel habits Musculoskeletal:negative for myalgias Neurological: negative for gait problems and tremors Behavioral/Psych: negative for abusive relationship, depression Endocrine: negative for temperature intolerance   Genitourinary:negative for abnormal menstrual periods, genital lesions, hot flashes, sexual problems and vaginal discharge Integument/breast: negative for breast lump, breast tenderness, nipple discharge and skin lesion(s)    Objective:       BP 157/92 mmHg  Pulse 118  Temp(Src) 98.9 F (37.2 C)  Wt 377 lb (171.006 kg) General:   alert  Skin:   no rash or abnormalities  Lungs:   clear to auscultation bilaterally  Heart:   regular rate and rhythm, S1, S2 normal, no murmur, click, rub or gallop  Breasts:   normal without suspicious masses, skin or nipple changes or axillary nodes  Abdomen:  normal findings: no organomegaly, soft, non-tender and no hernia  Pelvis:  External genitalia: normal general appearance Urinary system: urethral meatus normal and bladder without fullness, nontender Vaginal: normal without tenderness, induration or masses Cervix: normal appearance Adnexa: normal bimanual exam Uterus: anteverted and non-tender, normal size  Skin Tag Removal Procedure Note    PROCEDURE: Skin tag removal Performing Provider: [facility] Mariko Nowakowski CNM   Patient education prior to procedure, explained risk, benefits of skin tag removal, reviewed alternative options. Patient reported understanding. Gave consent to continue with procedure.   PROCEDURE:  Pregnancy Text :   not indicated       Sterile Preparation:   Betadinex3    The patient's right check was palpated and the skin tag located. The area was prepped with Betadinex3. The area around the skin tag was anesthestized with 1.5 cc of 1% lidocaine without epinephrine was injected. A 1.5 mm incision was made and the skin tag was removed in an intact manner. Skin tag sent to pathology.  Hemostasis.     Followup: The patient tolerated the procedure well without complications.  Standard post-procedure care is explained and return precautions are given.  [facility] Ardenia Stiner CNM Lab Review Urine pregnancy test Labs reviewed yes Radiologic studies reviewed no  50% of 30 min visit spent on counseling and coordination of care.   Assessment:    Healthy female exam.   Obesity  Facial skin tag removal  H/O HTN    Plan:    Education reviewed: calcium supplements, depression evaluation, low fat, low cholesterol diet, safe  sex/STD prevention, self breast exams, skin cancer screening and weight bearing exercise. Contraception: oral progesterone-only contraceptive. Mammogram ordered. Follow up in: 1 year.   Meds ordered this encounter  Medications  . fluconazole (DIFLUCAN) 200 MG tablet    Sig: Take 1 tablet (200 mg total) by mouth once.    Dispense:  3 tablet    Refill:  0  . terconazole (TERAZOL 7) 0.4 % vaginal cream    Sig: Place 1 applicator vaginally at bedtime.    Dispense:  45 g    Refill:  0  . norethindrone (MICRONOR,CAMILA,ERRIN) 0.35 MG tablet    Sig: Take 1 tablet (0.35 mg total) by mouth daily.    Dispense:  1 Package    Refill:  11   Orders Placed This Encounter  Procedures  . Urine culture  . MM DIGITAL SCREENING BILATERAL    Standing Status: Future     Number of Occurrences:      Standing Expiration Date: 05/12/2017    Order Specific Question:  Reason for Exam (SYMPTOM  OR DIAGNOSIS REQUIRED)    Answer:  annual exam    Order Specific Question:  Is the patient pregnant?     Answer:  No    Order Specific Question:  Preferred imaging location?    Answer:  [facility]  . Ambulatory referral to General Surgery    Referral Priority:  Routine    Referral Type:  Surgical    Referral Reason:  Specialty Services Required    Requested Specialty:  General Surgery    Number of Visits Requested:  1  . POCT urinalysis dipstick   Need to obtain previous records for mammograms.

## 2016-03-14 LAB — URINE CULTURE

## 2016-03-15 LAB — NUSWAB VAGINITIS PLUS (VG+)
CHLAMYDIA TRACHOMATIS, NAA: NEGATIVE
Candida albicans, NAA: NEGATIVE
Candida glabrata, NAA: NEGATIVE
NEISSERIA GONORRHOEAE, NAA: NEGATIVE
TRICH VAG BY NAA: NEGATIVE

## 2016-03-18 ENCOUNTER — Other Ambulatory Visit: Payer: Self-pay | Admitting: Certified Nurse Midwife

## 2016-03-18 ENCOUNTER — Telehealth: Payer: Self-pay | Admitting: *Deleted

## 2016-03-18 DIAGNOSIS — N390 Urinary tract infection, site not specified: Secondary | ICD-10-CM

## 2016-03-18 LAB — PAP IG AND HPV HIGH-RISK
HPV, high-risk: NEGATIVE
PAP SMEAR COMMENT: 0

## 2016-03-18 MED ORDER — NITROFURANTOIN MONOHYD MACRO 100 MG PO CAPS: ORAL_CAPSULE | ORAL | Status: DC

## 2016-03-18 NOTE — Telephone Encounter (Signed)
Patient wants to know how to use the cream- the pharmacy questioned it- and the patient also wants a refill of her Macrobid- she uses it prophylactic to prevent UTI after intercourse. 4:07 Call to patient - told her Radene Ouerazol is used for 7 nights and I will let [facility] know about the Macrobid refill request.

## 2016-03-18 NOTE — Telephone Encounter (Signed)
Thank you.  Please let her know a macrobid refill & Rx has been sent to the pharmacy.  Thanks again.  R.Elea Holtzclaw CNM

## 2016-07-25 ENCOUNTER — Telehealth: Payer: Self-pay | Admitting: *Deleted

## 2016-07-25 ENCOUNTER — Other Ambulatory Visit: Payer: Self-pay | Admitting: Certified Nurse Midwife

## 2016-07-25 DIAGNOSIS — B3731 Acute candidiasis of vulva and vagina: Secondary | ICD-10-CM

## 2016-07-25 DIAGNOSIS — B373 Candidiasis of vulva and vagina: Secondary | ICD-10-CM

## 2016-07-25 MED ORDER — TERCONAZOLE 0.8 % VA CREA: 1.0000 | TOPICAL_CREAM | Freq: Every day | VAGINAL | 0 refills | Status: DC

## 2016-07-25 MED ORDER — FLUCONAZOLE 200 MG PO TABS
200.0000 mg | ORAL_TABLET | Freq: Once | ORAL | 0 refills | Status: AC
Start: 2016-07-25 — End: 2016-07-25

## 2016-07-25 NOTE — Telephone Encounter (Signed)
Patient is requesting a refill of the Diflucan and Terazol. It really worked well before and she would like to have it again.

## 2016-07-25 NOTE — Telephone Encounter (Signed)
Please let her know that they have been sent to the pharmacy and that she can pick them up at her convenience.  Thank you.  R.Cena Bruhn CNM

## 2016-07-28 NOTE — Telephone Encounter (Signed)
Voicemail not set up.

## 2017-03-30 ENCOUNTER — Telehealth: Payer: Self-pay | Admitting: *Deleted

## 2017-03-30 DIAGNOSIS — Z3041 Encounter for surveillance of contraceptive pills: Secondary | ICD-10-CM

## 2017-03-30 MED ORDER — NORETHINDRONE 0.35 MG PO TABS: 1.0000 | ORAL_TABLET | Freq: Every day | ORAL | 11 refills | Status: DC

## 2017-03-30 MED ORDER — NORETHINDRONE 0.35 MG PO TABS: 1.0000 | ORAL_TABLET | Freq: Every day | ORAL | 0 refills | Status: DC

## 2017-03-30 NOTE — Telephone Encounter (Signed)
Pt needs a refill of her Birth control pills sent her pharmacy she had to reschedule her annual because he cycle came on and she would [facility] [address] [facility] [place],please advise.Marland KitchenMarland KitchenMarland Kitchen

## 2017-03-30 NOTE — Addendum Note (Signed)
Addended by: Dalphine Handing on: 03/30/2017 10:31 AM   Modules accepted: Orders

## 2017-03-30 NOTE — Telephone Encounter (Addendum)
TC to pt needs BC rf before upcoming AEX. Pt denies missing pills and she is not a smoker. Norethindrone [facility] [address] [facility] [place] with no rf's.

## 2017-04-01 ENCOUNTER — Ambulatory Visit: Payer: BC Managed Care – PPO | Admitting: Certified Nurse Midwife

## 2017-04-07 ENCOUNTER — Ambulatory Visit: Payer: BC Managed Care – PPO | Admitting: Certified Nurse Midwife

## 2017-04-17 ENCOUNTER — Ambulatory Visit: Payer: BC Managed Care – PPO | Admitting: Certified Nurse Midwife

## 2017-04-20 ENCOUNTER — Encounter: Payer: Self-pay | Admitting: Certified Nurse Midwife

## 2017-04-20 ENCOUNTER — Other Ambulatory Visit (HOSPITAL_COMMUNITY)
Admission: RE | Admit: 2017-04-20 | Discharge: 2017-04-20 | Disposition: A | Discharge status: Home / Self Care | Payer: Medicare Other | Source: Ambulatory Visit | Attending: Certified Nurse Midwife | Admitting: Certified Nurse Midwife

## 2017-04-20 ENCOUNTER — Ambulatory Visit (INDEPENDENT_AMBULATORY_CARE_PROVIDER_SITE_OTHER): Payer: Medicare Other | Admitting: Certified Nurse Midwife

## 2017-04-20 VITALS — BP 149/84 | HR 98 | Ht 67.0 in | Wt >= 6400 oz

## 2017-04-20 DIAGNOSIS — Z01419 Encounter for gynecological examination (general) (routine) without abnormal findings: Secondary | ICD-10-CM | POA: Insufficient documentation

## 2017-04-20 DIAGNOSIS — N390 Urinary tract infection, site not specified: Secondary | ICD-10-CM

## 2017-04-20 DIAGNOSIS — Z3041 Encounter for surveillance of contraceptive pills: Secondary | ICD-10-CM

## 2017-04-20 DIAGNOSIS — B3731 Acute candidiasis of vulva and vagina: Secondary | ICD-10-CM

## 2017-04-20 DIAGNOSIS — N898 Other specified noninflammatory disorders of vagina: Secondary | ICD-10-CM

## 2017-04-20 DIAGNOSIS — B373 Candidiasis of vulva and vagina: Secondary | ICD-10-CM

## 2017-04-20 DIAGNOSIS — N760 Acute vaginitis: Secondary | ICD-10-CM

## 2017-04-20 DIAGNOSIS — B9689 Other specified bacterial agents as the cause of diseases classified elsewhere: Secondary | ICD-10-CM

## 2017-04-20 LAB — POCT URINALYSIS DIPSTICK
BILIRUBIN UA: NEGATIVE
GLUCOSE UA: NEGATIVE
KETONES UA: NEGATIVE
Leukocytes, UA: NEGATIVE
Nitrite, UA: NEGATIVE
Protein, UA: NEGATIVE
RBC UA: NEGATIVE
UROBILINOGEN UA: 0.2 U/dL
pH, UA: 6 (ref 5.0–8.0)

## 2017-04-20 MED ORDER — METRONIDAZOLE 500 MG PO TABS: 500.0000 mg | ORAL_TABLET | Freq: Two times a day (BID) | ORAL | 0 refills | Status: AC

## 2017-04-20 MED ORDER — FLUCONAZOLE 150 MG PO TABS: 150.0000 mg | ORAL_TABLET | Freq: Once | ORAL | 0 refills | Status: AC

## 2017-04-20 MED ORDER — NORETHINDRONE 0.35 MG PO TABS: 1.0000 | ORAL_TABLET | Freq: Every day | ORAL | 12 refills | Status: DC

## 2017-04-20 MED ORDER — TERCONAZOLE 0.8 % VA CREA: 1.0000 | TOPICAL_CREAM | Freq: Every day | VAGINAL | 0 refills | Status: AC

## 2017-04-20 MED ORDER — NITROFURANTOIN MONOHYD MACRO 100 MG PO CAPS: ORAL_CAPSULE | ORAL | 5 refills | Status: DC

## 2017-04-20 MED ORDER — CLINDAMYCIN PHOSPHATE (1 DOSE) 2 % VA CREA: 1.0000 | TOPICAL_CREAM | Freq: Two times a day (BID) | VAGINAL | 0 refills | Status: DC

## 2017-04-20 NOTE — Progress Notes (Signed)
Subjective:        Anna Contreras is a 51 y.o. female here for a routine exam.  Current complaints: vaginal odor and burning with urination and vaginal symptoms of itching.    States that she has mammograms done at [facility], need records.  Declines STD screening.  States that she is on disability now and has full Medicare.  Discussed normal pap smear last year, still desires screening.  Blood pressure elevated today, has not taken her medications.  Desires pills for weight loss, discussed gastric bypass surgery and referral.  Patient does desire to loose weight but is scared of surgery.    Personal health questionnaire:  Is patient Ashkenazi Jewish, have a family history of breast and/or ovarian cancer: no Is there a family history of uterine cancer diagnosed at age < 4, gastrointestinal cancer, urinary tract cancer, family member who is a Personnel officer syndrome-associated carrier: no Is the patient overweight and hypertensive, family history of diabetes, personal history of gestational diabetes, preeclampsia or PCOS: yes Is patient over 46, have PCOS,  family history of premature CHD under age 83, diabetes, smoke, have hypertension or peripheral artery disease:  yes At any time, has a partner hit, kicked or otherwise hurt or frightened you?: no Over the past 2 weeks, have you felt down, depressed or hopeless?: yes Over the past 2 weeks, have you felt little interest or pleasure in doing things?:no   Gynecologic History No LMP recorded. Contraception: OCP (estrogen/progesterone) Last Pap: 01/2015. Results were: normal Last mammogram: unknown, ordered but patient did not schedule the study.Obstetric History OB History  Gravida Para Term Preterm AB Living  2 2 2     2   SAB TAB Ectopic Multiple Live Births          2    # Outcome Date GA Lbr Len/2nd Weight Sex Delivery Anes PTL Lv  2 Term 08/19/93    F    LIV  1 Term 11/29/87    F    LIV      Past Medical History:  Diagnosis  Date  . Hypertension     History reviewed. No pertinent surgical history.   Current Outpatient Prescriptions:  .  albuterol (PROVENTIL HFA;VENTOLIN HFA) 108 (90 BASE) MCG/ACT inhaler, Inhale 2 puffs into the lungs every 6 (six) hours as needed. For shortness of breath , Disp: , Rfl:  .  amitriptyline (ELAVIL) 25 MG tablet, , Disp: , Rfl:  .  amLODipine-valsartan (EXFORGE) 10-320 MG per tablet, Take 1 tablet by mouth daily., Disp: , Rfl:  .  cyclobenzaprine (FLEXERIL) 5 MG tablet, , Disp: , Rfl:  .  Fluticasone-Salmeterol (ADVAIR DISKUS) 250-50 MCG/DOSE AEPB, Inhale 1 puff into the lungs every morning.  , Disp: , Rfl:  .  gabapentin (NEURONTIN) 300 MG capsule, , Disp: , Rfl:  .  HYDROcodone-acetaminophen (NORCO) 10-325 MG tablet, Take 1 tablet by mouth every 6 (six) hours as needed., Disp: , Rfl:  .  lovastatin (MEVACOR) 20 MG tablet, Take by mouth every morning.  , Disp: , Rfl:  .  metFORMIN (GLUCOPHAGE) 500 MG tablet, , Disp: , Rfl:  .  montelukast (SINGULAIR) 10 MG tablet, , Disp: , Rfl:  .  norethindrone (MICRONOR,CAMILA,ERRIN) 0.35 MG tablet, Take 1 tablet (0.35 mg total) by mouth daily., Disp: 1 Package, Rfl: 0 .  omeprazole (PRILOSEC) 20 MG capsule, Take 20 mg by mouth daily.  , Disp: , Rfl:  .  potassium chloride SA (K-DUR,KLOR-CON) 20 MEQ tablet, , Disp: ,  Rfl:  .  triamcinolone cream (KENALOG) 0.1 %, Reported on 03/12/2016, Disp: , Rfl:  .  topiramate (TOPAMAX) 50 MG tablet, , Disp: , Rfl:  Allergies  Allergen Reactions  . Sulfa Antibiotics Hives    Social History  Substance Use Topics  . Smoking status: Former Smoker    Quit date: 12/01/1997  . Smokeless tobacco: Never Used  . Alcohol use No     Comment: social    Family History  Problem Relation Age of Onset  . Diabetes Mother   . Heart disease Mother   . Heart disease Father   . Diabetes Maternal Grandmother   . Heart disease Paternal Grandmother       Review of Systems  Constitutional: negative for fatigue and  weight loss Respiratory: negative for cough and wheezing Cardiovascular: negative for chest pain, fatigue and palpitations Gastrointestinal: negative for abdominal pain and change in bowel habits Musculoskeletal:negative for myalgias Neurological: negative for gait problems and tremors Behavioral/Psych: negative for abusive relationship, depression Endocrine: negative for temperature intolerance    Genitourinary:negative for abnormal menstrual periods, genital lesions, hot flashes, sexual problems and vaginal discharge Integument/breast: negative for breast lump, breast tenderness, nipple discharge and skin lesion(s)    Objective:       BP (!) 149/84   Pulse 98   Ht 5\' 7"  (1.702 m)   Wt (!) 411 lb (186.4 kg)   LMP 04/12/2017   BMI 64.37 kg/m  General:   alert  Skin:   no rash or abnormalities  Lungs:   clear to auscultation bilaterally  Heart:   regular rate and rhythm, S1, S2 normal, no murmur, click, rub or gallop  Breasts:   normal without suspicious masses, skin or nipple changes or axillary nodes  Abdomen:  normal findings: no organomegaly, soft, non-tender and no hernia  Pelvis:  External genitalia: normal general appearance Urinary system: urethral meatus normal and bladder without fullness, nontender Vaginal: normal without tenderness, induration or masses Cervix: normal appearance Adnexa: normal bimanual exam Uterus: anteverted and non-tender, normal size   Lab Review Urine pregnancy test Labs reviewed yes Radiologic studies reviewed no  50% of 30 min visit spent on counseling and coordination of care.    Assessment & plan    Healthy female exam.     1. Vaginal discharge  - Cervicovaginal ancillary only  2. Well woman exam with routine gynecological exam  - Cytology - PAP - POCT urinalysis dipstick  3. Morbid obesity (HCC)  - Amb Referral to Bariatric Surgery  4. BV (bacterial vaginosis)  - metroNIDAZOLE (FLAGYL) 500 MG tablet; Take 1 tablet (500  mg total) by mouth 2 (two) times daily.  Dispense: 14 tablet; Refill: 0 - Clindamycin Phosphate, 1 Dose, vaginal cream; Apply 1 Dose topically 2 (two) times daily.  Dispense: 5.8 g; Refill: 0  5. Yeast infection involving the vagina and surrounding area  - terconazole (TERAZOL 3) 0.8 % vaginal cream; Place 1 applicator vaginally at bedtime.  Dispense: 20 g; Refill: 0 - fluconazole (DIFLUCAN) 150 MG tablet; Take 1 tablet (150 mg total) by mouth once.  Dispense: 1 tablet; Refill: 0  6. Encounter for surveillance of contraceptive pills  - norethindrone (MICRONOR,CAMILA,ERRIN) 0.35 MG tablet; Take 1 tablet (0.35 mg total) by mouth daily.  Dispense: 1 Package; Refill: 12  Contraception: oral progesterone-only contraceptive. Follow up in: 1 year.     Need to obtain previous records from mammogram Possible management options include:  IUD Follow up as needed.

## 2017-04-21 ENCOUNTER — Other Ambulatory Visit: Payer: Self-pay | Admitting: Certified Nurse Midwife

## 2017-04-21 DIAGNOSIS — B9689 Other specified bacterial agents as the cause of diseases classified elsewhere: Secondary | ICD-10-CM

## 2017-04-21 DIAGNOSIS — N760 Acute vaginitis: Principal | ICD-10-CM

## 2017-04-21 LAB — CERVICOVAGINAL ANCILLARY ONLY
Bacterial vaginitis: NEGATIVE
CANDIDA VAGINITIS: NEGATIVE

## 2017-04-21 MED ORDER — CLINDAMYCIN PHOSPHATE (1 DOSE) 2 % VA CREA: 1.0000 | TOPICAL_CREAM | Freq: Once | VAGINAL | 0 refills | Status: AC

## 2017-04-22 LAB — CYTOLOGY - PAP
ADEQUACY: ABSENT
DIAGNOSIS: NEGATIVE
HPV: NOT DETECTED

## 2017-05-18 ENCOUNTER — Other Ambulatory Visit: Payer: Self-pay | Admitting: Certified Nurse Midwife

## 2017-07-06 ENCOUNTER — Other Ambulatory Visit: Payer: Self-pay | Admitting: Certified Nurse Midwife

## 2017-07-06 DIAGNOSIS — Z3041 Encounter for surveillance of contraceptive pills: Secondary | ICD-10-CM

## 2018-04-29 ENCOUNTER — Other Ambulatory Visit: Payer: Self-pay | Admitting: Certified Nurse Midwife

## 2018-04-29 DIAGNOSIS — N390 Urinary tract infection, site not specified: Secondary | ICD-10-CM

## 2018-05-10 ENCOUNTER — Telehealth: Payer: Self-pay | Admitting: *Deleted

## 2018-05-10 NOTE — Telephone Encounter (Signed)
Please refer her to her PCP for management of frequent UTI.  Thank you. Rieley Hausman

## 2018-05-10 NOTE — Telephone Encounter (Signed)
Pt called to office for refill on Macrobid. [facility] [address]   Please send if approved.

## 2018-05-11 ENCOUNTER — Other Ambulatory Visit: Payer: Self-pay | Admitting: *Deleted

## 2018-05-11 NOTE — Telephone Encounter (Signed)
Spoke with pt and she states that you had been giving her Macrobid to take prior to intercourse. She states this helps with infections she get related to intercourse.  Pt does not state if she has a PCP.  Please advise.

## 2018-05-12 ENCOUNTER — Other Ambulatory Visit: Payer: Self-pay | Admitting: Certified Nurse Midwife

## 2018-05-12 DIAGNOSIS — N390 Urinary tract infection, site not specified: Secondary | ICD-10-CM

## 2018-05-12 MED ORDER — NITROFURANTOIN MONOHYD MACRO 100 MG PO CAPS: ORAL_CAPSULE | ORAL | 0 refills | Status: DC

## 2018-05-12 NOTE — Progress Notes (Unsigned)
om

## 2018-05-12 NOTE — Telephone Encounter (Signed)
Refill sent.  Internal medicine referral sent to find out why she is having frequent UTIs.  Thank you Anna Contreras

## 2018-05-14 ENCOUNTER — Telehealth: Payer: Self-pay

## 2018-05-14 NOTE — Telephone Encounter (Signed)
Pt called and left voicemail stating she did not know why her macrobid had been refilled. I called pt back and advised her that the macrobid had been called in two days ago by Boykin Reaperachelle and that DexterRachelle also sent a referral for her to internal medicine to figure out why she is getting frequent UTIs. Pt states that she does not want to go to an internal medicine dr. And that the reason shes on the macrobid is because Dr. Jean RosenthalJackson put her on it as a preventative to take every time after she has intercourse. Pt is requesting refills for the rest of the year, she is not due for an annual until 04/2019. I advised pt I would send a note to the provider and that she would get a call back if the refills are approved. Pt verbalized understanding.

## 2018-05-18 NOTE — Telephone Encounter (Signed)
No more refills, needs primary management or uro/gyn management.  Thank you. Green Quincy

## 2018-05-19 ENCOUNTER — Other Ambulatory Visit: Payer: Self-pay

## 2018-05-19 NOTE — Telephone Encounter (Signed)
See previous note about birth  Control refill

## 2018-05-19 NOTE — Telephone Encounter (Signed)
Pt notified that no further refills or Macrobid will be given and that she needs to follow up with Internal medicine or primary care. Pt verbalized understanding. Pt is now requesting that her birth control (Camila) be refilled. Pt states that she was told she does not have to have annual every year but every two years and she had an annual in 2018. Please advise.

## 2018-05-20 ENCOUNTER — Other Ambulatory Visit: Payer: Self-pay | Admitting: Certified Nurse Midwife

## 2018-05-20 DIAGNOSIS — Z3041 Encounter for surveillance of contraceptive pills: Secondary | ICD-10-CM

## 2018-05-20 MED ORDER — NORETHINDRONE 0.35 MG PO TABS: 1.0000 | ORAL_TABLET | Freq: Every day | ORAL | 1 refills | Status: DC

## 2018-05-20 NOTE — Telephone Encounter (Signed)
Anna Contreras reordered per her request.  Thank you. Bolton Canupp

## 2018-06-18 ENCOUNTER — Other Ambulatory Visit: Payer: Self-pay | Admitting: Certified Nurse Midwife

## 2018-06-18 DIAGNOSIS — N390 Urinary tract infection, site not specified: Secondary | ICD-10-CM

## 2018-08-08 ENCOUNTER — Other Ambulatory Visit: Payer: Self-pay | Admitting: Obstetrics

## 2018-08-08 DIAGNOSIS — Z3041 Encounter for surveillance of contraceptive pills: Secondary | ICD-10-CM

## 2018-08-09 ENCOUNTER — Other Ambulatory Visit: Payer: Self-pay | Admitting: Obstetrics

## 2018-09-14 ENCOUNTER — Other Ambulatory Visit: Payer: Self-pay | Admitting: Obstetrics

## 2018-09-14 DIAGNOSIS — N390 Urinary tract infection, site not specified: Secondary | ICD-10-CM

## 2018-09-15 ENCOUNTER — Telehealth: Payer: Self-pay

## 2018-09-15 NOTE — Telephone Encounter (Signed)
Return call to pt  Pt is getting Annual Exam scheduled However needs CAMILLA Rx sent to [facility] in [place] [phone]  Pt had breakthrough bleeding w/ generic Camilla pt wants brand Camilla w/at least 2 refills till next appt for Annual.  Please advise.

## 2018-09-16 NOTE — Telephone Encounter (Signed)
There is no difference in the medications.  Request denied.

## 2018-09-22 ENCOUNTER — Other Ambulatory Visit: Payer: Self-pay

## 2018-09-22 DIAGNOSIS — Z3041 Encounter for surveillance of contraceptive pills: Secondary | ICD-10-CM

## 2018-09-22 MED ORDER — NORETHINDRONE 0.35 MG PO TABS: 1.0000 | ORAL_TABLET | Freq: Every day | ORAL | 2 refills | Status: AC

## 2018-09-22 NOTE — Telephone Encounter (Signed)
Can we refill Rx until pt can have Annual at least 2 refills of Camilla to [facility] in [place] [phone] Please review

## 2018-09-22 NOTE — Progress Notes (Signed)
Ok to refill Rx until Annual per Dr.Harper.

## 2018-09-22 NOTE — Telephone Encounter (Signed)
You may call in brand name Shaune Pascal but there should be no significant difference in brand name and generic.

## 2018-11-18 ENCOUNTER — Telehealth: Payer: Self-pay

## 2018-11-18 DIAGNOSIS — B9689 Other specified bacterial agents as the cause of diseases classified elsewhere: Secondary | ICD-10-CM

## 2018-11-18 DIAGNOSIS — N760 Acute vaginitis: Principal | ICD-10-CM

## 2018-11-18 MED ORDER — METRONIDAZOLE 500 MG PO TABS: 500.0000 mg | ORAL_TABLET | Freq: Two times a day (BID) | ORAL | 0 refills | Status: AC

## 2018-11-18 NOTE — Telephone Encounter (Signed)
Pt called requesting rx for flagyl. She states that she is having vaginal odor with a fishy discharge. Per protocol rx sent to the pharmacy

## 2019-01-03 ENCOUNTER — Telehealth: Payer: Self-pay

## 2019-01-03 DIAGNOSIS — Z3041 Encounter for surveillance of contraceptive pills: Secondary | ICD-10-CM

## 2019-01-03 NOTE — Telephone Encounter (Signed)
Called patient -  Advised our office was advised by her insurance, [employer], that we will need to prescribe an alternative drug  - Norlyda 0.35 mg is not on her formulary. Patient advised to contact [employer] - find out what alternatives she has - call our office with information so we can electronically send in new prescription.   Patient stated she understood and would call back tomorrow ( 01/04/2019) with information.

## 2019-01-05 ENCOUNTER — Telehealth: Payer: Self-pay

## 2019-01-05 NOTE — Telephone Encounter (Signed)
Disregard

## 2019-01-05 NOTE — Telephone Encounter (Deleted)
R

## 2019-01-05 NOTE — Telephone Encounter (Signed)
Returned patient call - Unable to leave voicemail, per recording voicemail is full and no messages can be left at this time, please try your call at another time.  If patient call in - please refer to previous message.   [employer] letter is scanned in chart as well.

## 2019-01-05 NOTE — Telephone Encounter (Signed)
disregard

## 2019-01-06 MED ORDER — NORETHINDRONE 0.35 MG PO TABS: 1.0000 | ORAL_TABLET | Freq: Every day | ORAL | 3 refills | Status: AC

## 2019-01-06 NOTE — Telephone Encounter (Signed)
Reordered Camila 0.35 mg per patient request.

## 2019-01-06 NOTE — Addendum Note (Signed)
Addended by: Areta Haber B on: 01/06/2019 08:59 AM   Modules accepted: Orders

## 2019-03-28 ENCOUNTER — Telehealth: Payer: Self-pay

## 2019-03-28 NOTE — Telephone Encounter (Signed)
  Contacted Pharmacy regarding a fax they sent for PA. I was advised that they already switched and processed Rx, we are to disregard fax as the systems automatically sends it.

## 2019-04-08 ENCOUNTER — Other Ambulatory Visit: Payer: Self-pay | Admitting: Obstetrics

## 2019-04-08 ENCOUNTER — Telehealth: Payer: Self-pay | Admitting: *Deleted

## 2019-04-08 NOTE — Telephone Encounter (Signed)
Pt called to office for refills.  Pt states she has a boil and need Doxycycline and Mupirocin cream. Pt states she also needs Camila brand name BCP sent in to pharmacy, I believe she keeps getting generic.  Can you please send Rx's if approved to [facility] [address].

## 2019-04-13 ENCOUNTER — Telehealth: Payer: Self-pay | Admitting: *Deleted

## 2019-04-13 NOTE — Telephone Encounter (Signed)
Attempt to call pt regarding recent refill request. Medication pt is wanting refilled is not on med list in chart (current/hx).  LM on VM for pt to call and discuss.

## 2019-04-15 ENCOUNTER — Telehealth: Payer: Self-pay | Admitting: *Deleted

## 2019-04-15 NOTE — Telephone Encounter (Signed)
Attempt to reach pt again regarding Rx request. LM on VM that Rx's are not listed on med list- doesn't look as though we ordered. Advised pt to contact office or she may be seen at Urgent Care.

## 2022-03-20 ENCOUNTER — Other Ambulatory Visit: Payer: Self-pay | Admitting: Cardiovascular Disease

## 2022-03-20 ENCOUNTER — Other Ambulatory Visit (HOSPITAL_COMMUNITY): Payer: Self-pay | Admitting: Cardiovascular Disease

## 2022-03-20 DIAGNOSIS — R072 Precordial pain: Secondary | ICD-10-CM

## 2022-03-20 DIAGNOSIS — R079 Chest pain, unspecified: Secondary | ICD-10-CM

## 2022-04-02 ENCOUNTER — Ambulatory Visit (HOSPITAL_COMMUNITY): Payer: Medicare Other

## 2022-04-11 ENCOUNTER — Other Ambulatory Visit (HOSPITAL_COMMUNITY): Payer: Self-pay | Admitting: *Deleted

## 2022-04-11 ENCOUNTER — Encounter (HOSPITAL_COMMUNITY): Payer: Self-pay

## 2022-04-11 MED ORDER — IVABRADINE HCL 7.5 MG PO TABS: ORAL_TABLET | ORAL | 0 refills | Status: AC

## 2022-04-11 MED ORDER — METOPROLOL TARTRATE 100 MG PO TABS: ORAL_TABLET | ORAL | 0 refills | Status: AC

## 2022-04-14 ENCOUNTER — Other Ambulatory Visit (HOSPITAL_COMMUNITY): Payer: Self-pay | Admitting: *Deleted

## 2022-04-14 MED ORDER — PREDNISONE 50 MG PO TABS: ORAL_TABLET | ORAL | 0 refills | Status: DC

## 2022-04-14 MED ORDER — DIPHENHYDRAMINE HCL 50 MG PO TABS: ORAL_TABLET | ORAL | 0 refills | Status: AC

## 2022-04-15 ENCOUNTER — Ambulatory Visit (HOSPITAL_COMMUNITY): Admission: RE | Admit: 2022-04-15 | Payer: Medicare HMO | Source: Ambulatory Visit

## 2022-05-01 ENCOUNTER — Telehealth (HOSPITAL_COMMUNITY): Payer: Self-pay | Admitting: *Deleted

## 2022-05-01 ENCOUNTER — Other Ambulatory Visit (HOSPITAL_COMMUNITY): Payer: Self-pay | Admitting: *Deleted

## 2022-05-01 MED ORDER — PREDNISONE 50 MG PO TABS: ORAL_TABLET | ORAL | 0 refills | Status: AC

## 2022-05-01 NOTE — Telephone Encounter (Signed)
Patient calling regarding upcoming cardiac imaging study; pt verbalizes understanding of appt date/time, parking situation and where to check in, pre-test NPO status and medications ordered, and verified current allergies; name and call back number provided for further questions should they arise  Larey Brick RN Navigator Cardiac Imaging [facility] [phone] office [phone] cell  Review with patient how to take 13 hour prep and HR lowering medications for test. She is aware to arrive at 3:30pm. Patient aware to pick up prednisone from pharmacy.

## 2022-05-02 ENCOUNTER — Ambulatory Visit (HOSPITAL_COMMUNITY)
Admission: RE | Admit: 2022-05-02 | Discharge: 2022-05-02 | Disposition: A | Discharge status: Home / Self Care | Payer: Medicare HMO | Source: Ambulatory Visit | Attending: Family Medicine | Admitting: Family Medicine

## 2022-05-02 DIAGNOSIS — I288 Other diseases of pulmonary vessels: Secondary | ICD-10-CM | POA: Diagnosis not present

## 2022-05-02 DIAGNOSIS — R079 Chest pain, unspecified: Secondary | ICD-10-CM | POA: Diagnosis present

## 2022-05-02 DIAGNOSIS — K760 Fatty (change of) liver, not elsewhere classified: Secondary | ICD-10-CM | POA: Diagnosis not present

## 2022-05-02 MED ORDER — IOHEXOL 350 MG/ML SOLN
140.0000 mL | Freq: Once | INTRAVENOUS | Status: AC | PRN
Start: 2022-05-02 — End: 2022-05-02
  Administered 2022-05-02: 140 mL via INTRAVENOUS

## 2022-05-02 MED ORDER — NITROGLYCERIN 0.4 MG SL SUBL
SUBLINGUAL_TABLET | SUBLINGUAL | Status: AC
  Filled 2022-05-02: qty 2

## 2022-05-02 MED ORDER — DILTIAZEM HCL 25 MG/5ML IV SOLN
INTRAVENOUS | Status: AC
  Administered 2022-05-02: 10 mg via INTRAVENOUS
  Filled 2022-05-02: qty 5

## 2022-05-02 MED ORDER — METOPROLOL TARTRATE 5 MG/5ML IV SOLN
INTRAVENOUS | Status: AC
  Administered 2022-05-02: 10 mg via INTRAVENOUS
  Filled 2022-05-02: qty 20

## 2022-05-02 MED ORDER — DILTIAZEM HCL 25 MG/5ML IV SOLN
10.0000 mg | INTRAVENOUS | Status: DC | PRN
  Administered 2022-05-02: 10 mg via INTRAVENOUS

## 2022-05-02 MED ORDER — NITROGLYCERIN 0.4 MG SL SUBL
0.8000 mg | SUBLINGUAL_TABLET | Freq: Once | SUBLINGUAL | Status: AC
  Administered 2022-05-02: 0.8 mg via SUBLINGUAL

## 2022-05-02 MED ORDER — METOPROLOL TARTRATE 5 MG/5ML IV SOLN
10.0000 mg | INTRAVENOUS | Status: DC | PRN
  Administered 2022-05-02: 10 mg via INTRAVENOUS

## 2023-05-14 IMAGING — CT CT HEART MORP W/ CTA COR W/ SCORE W/ CA W/CM &/OR W/O CM
3 of 9 series · 6 of 20 positions shown, 7 images · IV contrast (APPLIED)
Comparison: 08/28/2011.

Addendum:
CLINICAL DATA: Chest pain

EXAM:
Cardiac CTA
MEDICATIONS:
Sub lingual nitro.  4mg
Lopressor 100 mg
Ivabradine 15 mg
Contrast Allergy Prophylaxis
TECHNIQUE: The patient was scanned on a Siemens Force [REDACTED]ice scanner. Gantry
rotation speed was 250 msecs. Collimation was .6 mm. A 100 kV
prospective scan was triggered in the ascending thoracic aorta at
140 HU's Full mA was used between 35% and 75% of the R-R interval.
Average HR during the scan was 76 bpm. The 3D data set was
interpreted on a dedicated work station using MPR, MIP and VRT
modes. A total of 80 cc of contrast was used.

[Series 12: ts syst sharp · axial · 0.41mm/px · z∈[-95,-55]mm · 2 of 301 slices shown]
[im 101/301  lung]
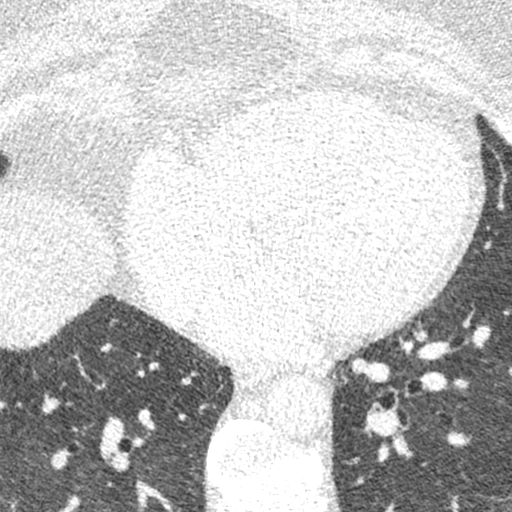
[im 201/301  lung]
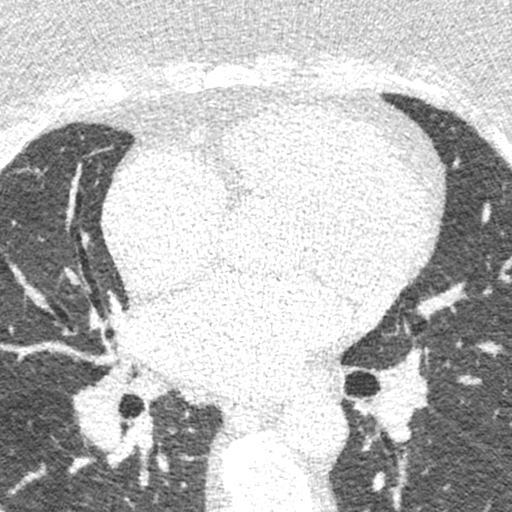

[Series 14: best diast · axial · 0.41mm/px · z∈[-95,-55]mm · 2 of 301 slices shown, 3 images]
[im 101/301  vessel]
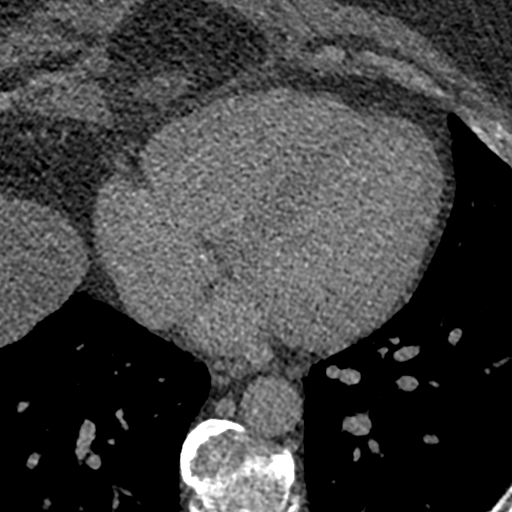
[im 101/301  lung]
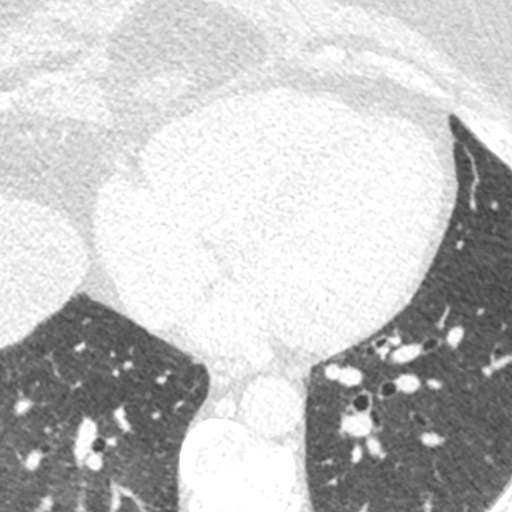
[im 201/301  vessel]
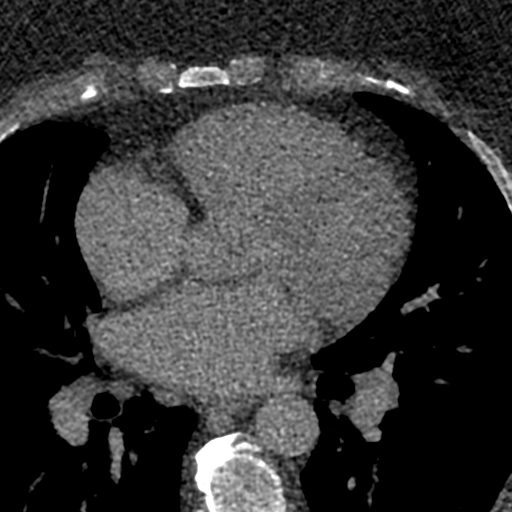

[Series 15: best syst · axial · 0.41mm/px · z∈[-95,-55]mm · 2 of 301 slices shown]
[im 101/301  vessel]
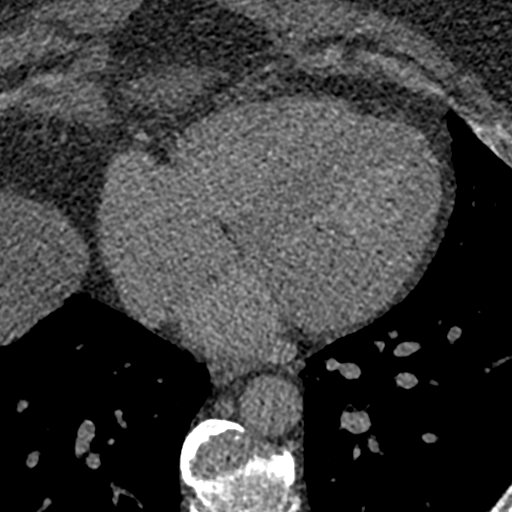
[im 201/301  vessel]
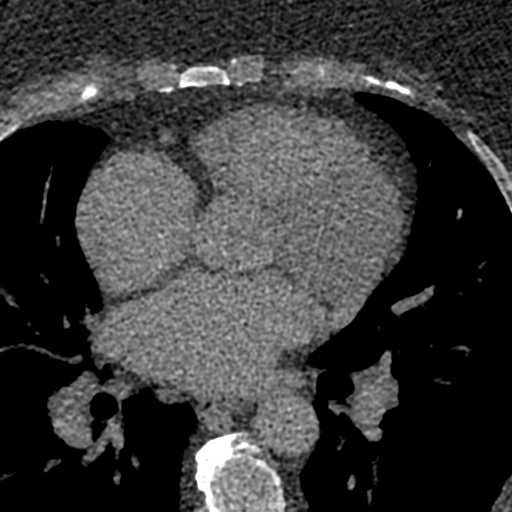

[6 of 20 positions shown; findings below may reference images not displayed]

FINDINGS: Study was non diagnostic due to patients weight over 400 lbs, poor
table movement and poor opacification of vessels < 150 HU's
As well as relatively high HR

That being said appeared low risk with no obvious calcium in
coronary arteries and no particular stenosis in proximal vessels
seen

The RCA was fairly well seen and normal

Dr Indritt and Wilson Stalin are aware and may suggest alternative study
such as PET/CT to referring physician
IMPRESSION: Non diagnostic study No calcium noted in coronary arteries

Simplis Edwin

EXAM:
OVER-READ INTERPRETATION  CT CHEST

The following report is an over-read performed by radiologist Dr.
over-read does not include interpretation of cardiac or coronary
anatomy or pathology. The interpretation by the cardiologist is
attached.
FINDINGS: Enlarged pulmonic trunk. Heart is enlarged. No pericardial or
pleural effusion. Minimal bihilar lymphoid tissue. No suspicious
pulmonary nodules. Visualized portion of the liver is decreased in
attenuation. Degenerative changes in the spine.
IMPRESSION: 1. Hepatic steatosis.
2. Enlarged pulmonic trunk, indicative of pulmonary arterial
hypertension.

*** End of Addendum ***
FINDINGS: Study was non diagnostic due to patients weight over 400 lbs, poor
table movement and poor opacification of vessels < 150 HU's
As well as relatively high HR

That being said appeared low risk with no obvious calcium in
coronary arteries and no particular stenosis in proximal vessels
seen

The RCA was fairly well seen and normal

Dr Indritt and Wilson Stalin are aware and may suggest alternative study
such as PET/CT to referring physician
IMPRESSION: Non diagnostic study No calcium noted in coronary arteries

Simplis Edwin
